# Patient Record
Sex: Male | Born: 1965 | Race: White | Hispanic: No | Marital: Married | Smoking: Never smoker
Health system: Southern US, Community
[De-identification: ages and names within clinical notes are randomized; demographics above are authoritative.]

## PROBLEM LIST (undated history)

## (undated) DIAGNOSIS — R519 Headache, unspecified: Secondary | ICD-10-CM

## (undated) DIAGNOSIS — G44009 Cluster headache syndrome, unspecified, not intractable: Secondary | ICD-10-CM

## (undated) HISTORY — DX: Cluster headache syndrome, unspecified, not intractable: G44.009

## (undated) HISTORY — DX: Headache, unspecified: R51.9

---

## 2013-05-15 ENCOUNTER — Other Ambulatory Visit (HOSPITAL_COMMUNITY): Payer: Self-pay | Admitting: Nurse Practitioner

## 2013-05-15 ENCOUNTER — Encounter (HOSPITAL_COMMUNITY): Payer: Self-pay

## 2013-05-15 ENCOUNTER — Ambulatory Visit (HOSPITAL_COMMUNITY)
Admission: RE | Admit: 2013-05-15 | Discharge: 2013-05-15 | Disposition: A | Discharge status: Home / Self Care | Payer: Managed Care, Other (non HMO) | Source: Ambulatory Visit | Attending: Nurse Practitioner | Admitting: Nurse Practitioner

## 2013-05-15 DIAGNOSIS — R1031 Right lower quadrant pain: Secondary | ICD-10-CM

## 2013-05-15 DIAGNOSIS — R1032 Left lower quadrant pain: Secondary | ICD-10-CM

## 2013-05-15 DIAGNOSIS — Q618 Other cystic kidney diseases: Secondary | ICD-10-CM | POA: Insufficient documentation

## 2013-05-15 DIAGNOSIS — R933 Abnormal findings on diagnostic imaging of other parts of digestive tract: Secondary | ICD-10-CM | POA: Insufficient documentation

## 2013-05-15 DIAGNOSIS — N433 Hydrocele, unspecified: Secondary | ICD-10-CM | POA: Insufficient documentation

## 2013-05-15 MED ORDER — IOHEXOL 300 MG/ML  SOLN
100.0000 mL | Freq: Once | INTRAMUSCULAR | Status: AC | PRN
  Administered 2013-05-15: 100 mL via INTRAVENOUS

## 2015-11-02 IMAGING — CT CT ABD-PELV W/ CM
2 of 5 series · 15 of 46 positions shown, 17 images · IV contrast (Omnipaque 300)
Comparison: None.

CLINICAL DATA: Right lower quadrant pain, left lower quadrant pain

EXAM:
CT ABDOMEN AND PELVIS WITH CONTRAST
TECHNIQUE: Multidetector CT imaging of the abdomen and pelvis was performed
using the standard protocol following bolus administration of
intravenous contrast.
CONTRAST:  100mL OMNIPAQUE IOHEXOL 300 MG/ML  SOLN

[Series 2: abd_pel_with 5.0 b40f · axial · 0.77mm/px · z∈[-508,-83]mm · 12 of 97 slices shown, 14 images]
[im 6/97  soft-tissue]
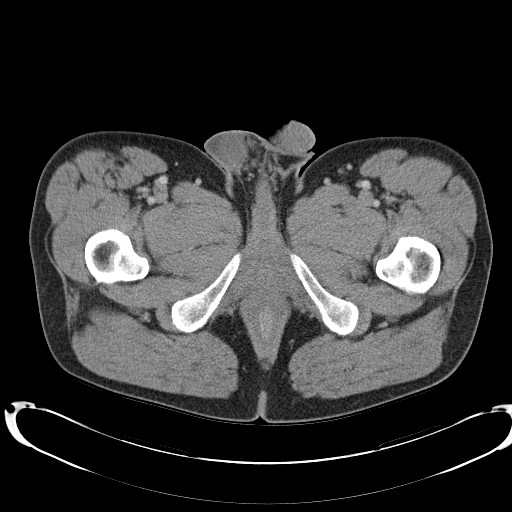
[im 6/97  bone]
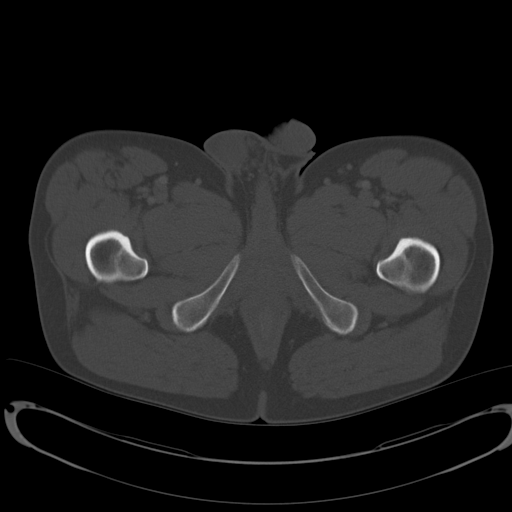
[im 17/97  soft-tissue]
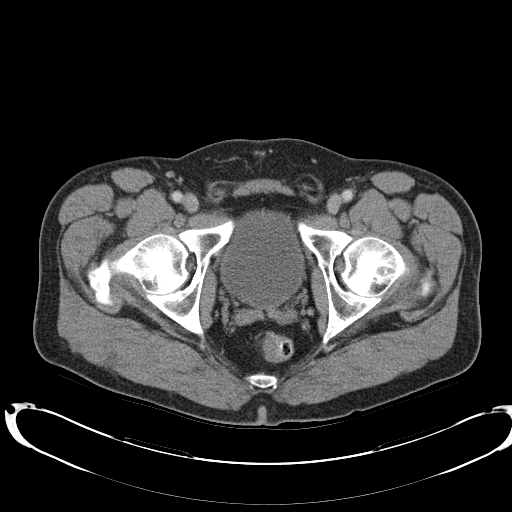
[im 22/97  soft-tissue]
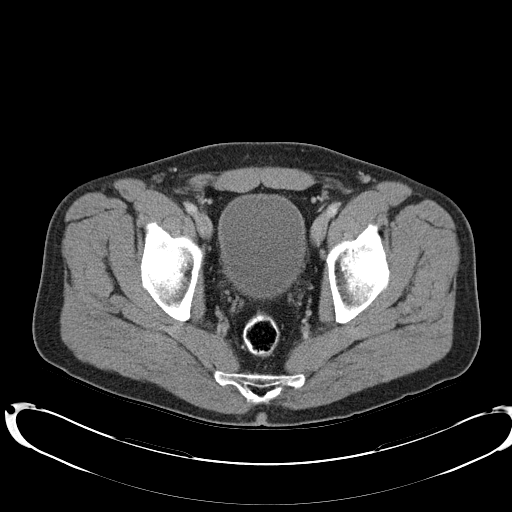
[im 27/97  soft-tissue]
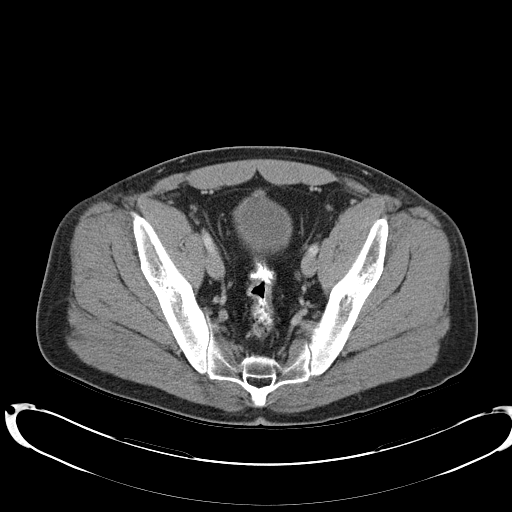
[im 38/97  soft-tissue]
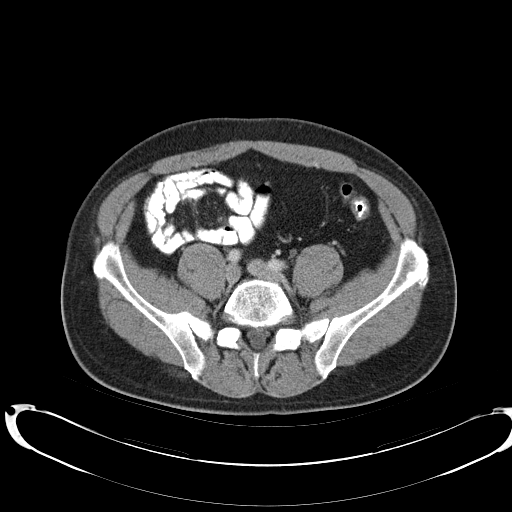
[im 43/97  soft-tissue]
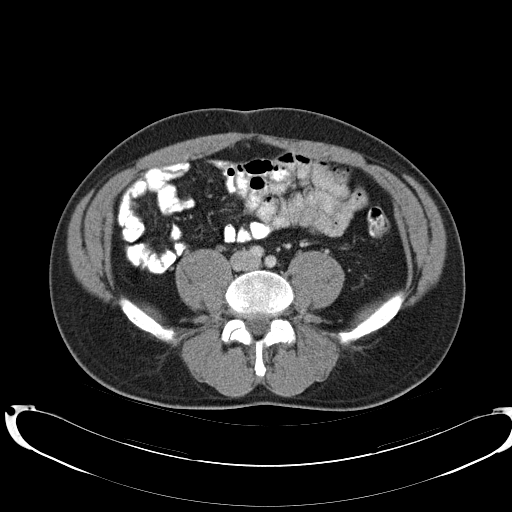
[im 54/97  soft-tissue]
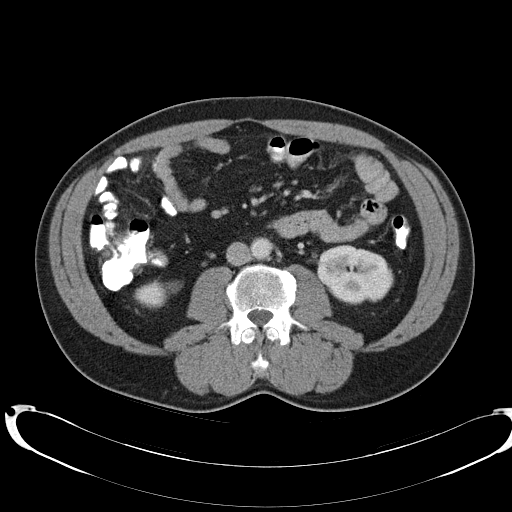
[im 59/97  soft-tissue]
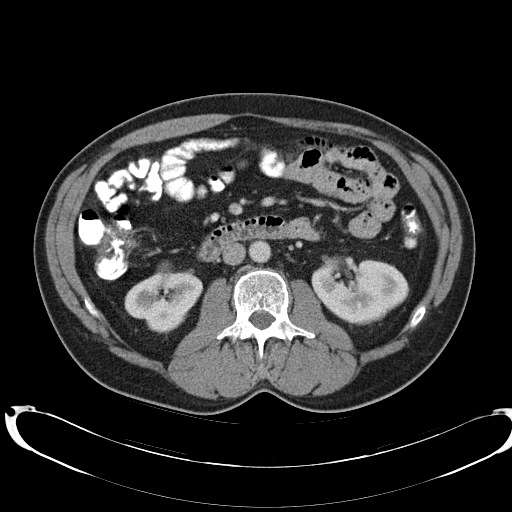
[im 70/97  soft-tissue]
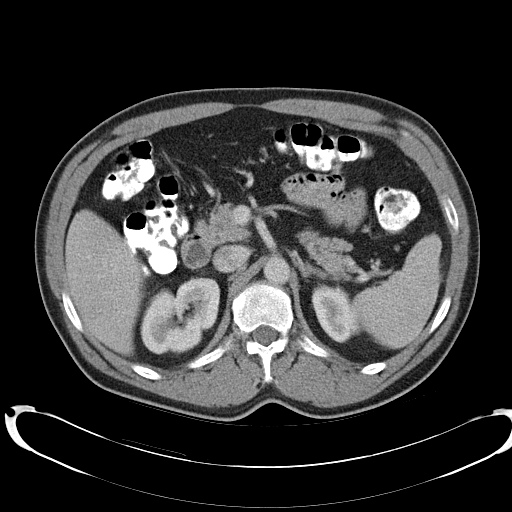
[im 70/97  bone]
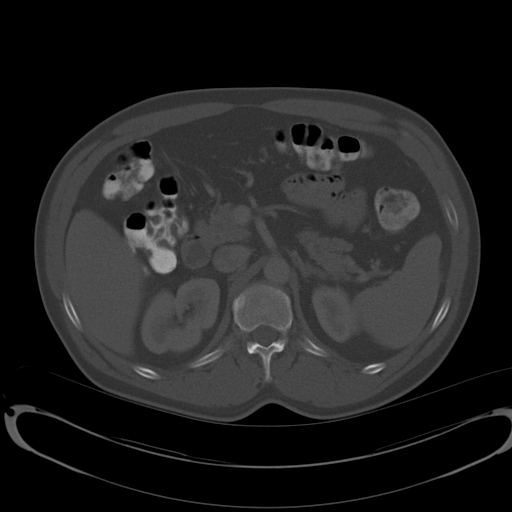
[im 75/97  soft-tissue]
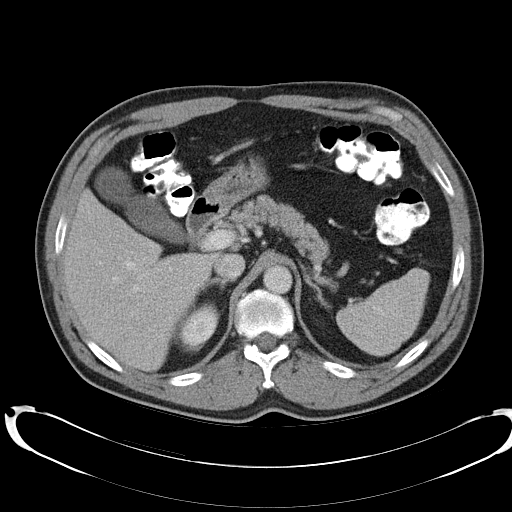
[im 81/97  soft-tissue]
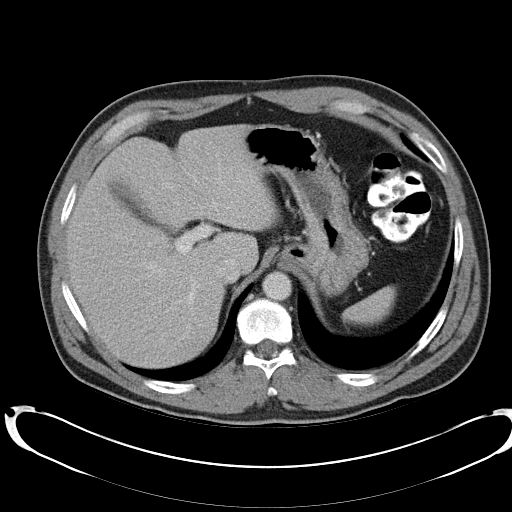
[im 91/97  soft-tissue]
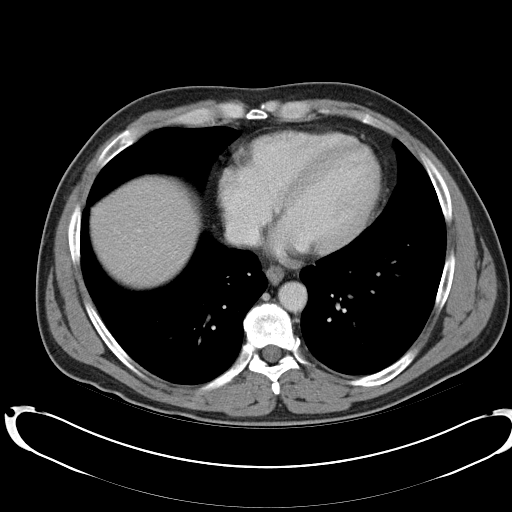

[Series 4: abd_pel_with 3.0 spo cor · coronal · 0.72mm/px · 3 of 85 slices shown]
[im 29/85  soft-tissue]
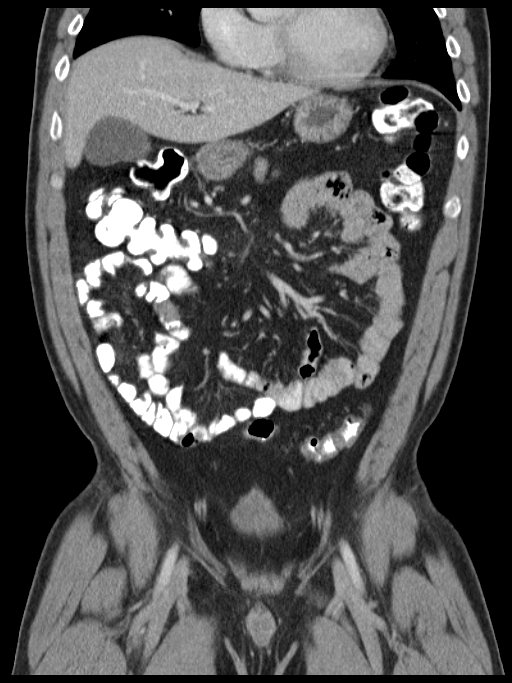
[im 38/85  soft-tissue]
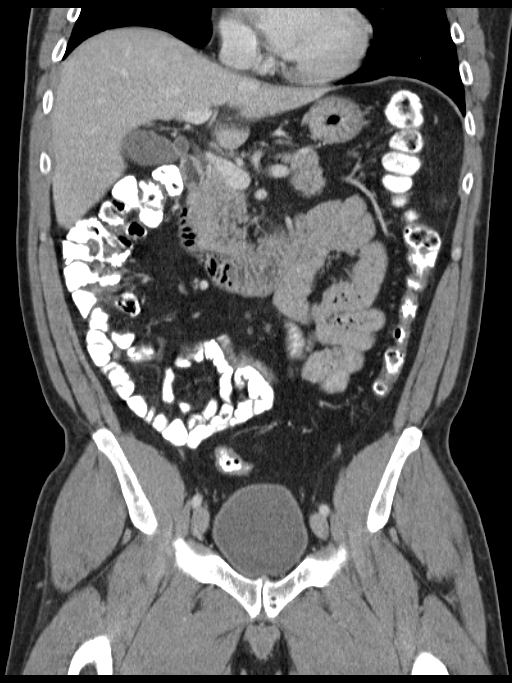
[im 47/85  soft-tissue]
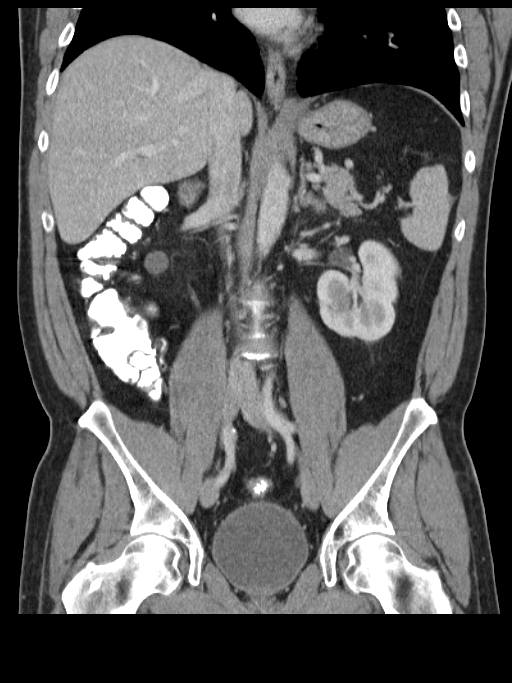

[15 of 46 positions shown; findings below may reference images not displayed]

FINDINGS: Sagittal images of the spine shows disc space flattening with mild
anterior and mild posterior spurring at L5-S1 level. Mild disc bulge
at L5-S1 level.

Lung bases are unremarkable.

Mild distended gallbladder without evidence of calcified gallstones.
Enhanced liver, pancreas spleen and adrenal glands are unremarkable.
Small accessory splenule is noted.

Bilateral kidney shows a lobulated contour. There is a cyst in
midpole of the right kidney measures 1.8 cm. Tiny cyst in lower pole
of the right kidney measures 6 mm. Tiny cyst in midpole of the right
kidney measures 8.8 mm. No hydronephrosis or hydroureter.

Delayed renal images shows bilateral renal symmetrical excretion.
Bilateral visualized proximal ureter is unremarkable.

Oral contrast material was given to the patient. No small bowel
obstruction. No ascites or free air. No adenopathy. Normal appendix.
No pericecal inflammation. The terminal ileum is unremarkable.

No evidence of diverticulitis. There is minimal thickening of distal
left colon and proximal sigmoid colon wall. This most likely is due
to incomplete distension/collapsed wall rather than colitis. No
surrounding inflammation. No mesenteric fluid collection. The
urinary bladder is unremarkable. No distal colonic obstruction.
Prostate gland and seminal vesicles are unremarkable. No calcified
calculi are noted within urinary bladder. Tiny right hydrocele.
IMPRESSION: 1. No small bowel or colonic obstruction. Nonspecific minimal
thickening of distal left colon and proximal sigmoid colon wall most
likely due to incomplete distended partially collapsed colonic wall.
Less likely colitis. No mesenteric stranding. No acute
diverticulitis.
2. No hydronephrosis or hydroureter.  Right renal cysts are noted.
3. No pericecal inflammation.  Normal appendix.
4. Unremarkable urinary bladder.
5. Tiny right hydrocele.

## 2020-10-24 ENCOUNTER — Encounter: Payer: Self-pay | Admitting: Neurology

## 2020-11-20 ENCOUNTER — Telehealth: Payer: Self-pay

## 2020-11-20 NOTE — Telephone Encounter (Signed)
error 

## 2020-12-01 ENCOUNTER — Encounter: Payer: Self-pay | Admitting: *Deleted
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
## 2020-12-03 ENCOUNTER — Ambulatory Visit (INDEPENDENT_AMBULATORY_CARE_PROVIDER_SITE_OTHER): Payer: Managed Care, Other (non HMO) | Admitting: Psychiatry

## 2020-12-03 ENCOUNTER — Encounter: Payer: Self-pay | Admitting: Psychiatry

## 2020-12-03 VITALS — BP 188/107 | HR 78 | Ht 65.0 in | Wt 179.0 lb

## 2020-12-03 DIAGNOSIS — G4451 Hemicrania continua: Secondary | ICD-10-CM

## 2020-12-03 MED ORDER — INDOMETHACIN 25 MG PO CAPS: ORAL_CAPSULE | ORAL | 0 refills | Status: AC

## 2020-12-03 NOTE — Patient Instructions (Addendum)
Start Indomethacin to assess for Hemicrania Continua  Week  1:  -Please obtain generic omeprazole (20 mg) [[employer], [employer], [employer], etc] and start one daily. You may increase to twice a day if necessary. -Please start indomethacin (indocin) 25 mg (one capsule) 3 times per day with meals. -If, in one week, you are headache-free, this is your dose, stay on it. -If, in one week, you are tolerating the indocin, but have headache, then increase the dose as below. Week 2: -Please increase indocin to 50 mg (2 capsules) 3 times per day with meals. -If, in one week, you are headache-free, this is your dose, stay on it. -If, in one week, you are tolerating the indocin, but have headache, then increase the dose as below. Weeks 3:  -Please increase indocin to 75 mg (3 capsules) 3 times per day with meals. -If, in one week, you are headache-free, this is your dose, stay on it. -If, in one week, you have partial relief, call or see her neurologist. -If, in one week, you have no relief, stop the indocin.

## 2020-12-22 ENCOUNTER — Ambulatory Visit: Payer: Managed Care, Other (non HMO) | Admitting: Neurology

## 2020-12-22 ENCOUNTER — Ambulatory Visit: Payer: Managed Care, Other (non HMO) | Admitting: Psychiatry

## 2020-12-23 ENCOUNTER — Other Ambulatory Visit: Payer: Self-pay | Admitting: Psychiatry

## 2020-12-23 ENCOUNTER — Telehealth: Payer: Self-pay | Admitting: Psychiatry

## 2020-12-23 ENCOUNTER — Encounter: Payer: Self-pay | Admitting: *Deleted

## 2020-12-23 MED ORDER — SUMATRIPTAN SUCCINATE 6 MG/0.5ML ~~LOC~~ SOAJ: 6.0000 mg | SUBCUTANEOUS | 2 refills | Status: DC | PRN

## 2020-12-23 MED ORDER — PREDNISONE 10 MG PO TABS: ORAL_TABLET | ORAL | 0 refills | Status: AC

## 2020-12-23 MED ORDER — VERAPAMIL HCL 40 MG PO TABS: ORAL_TABLET | ORAL | 2 refills | Status: DC

## 2020-12-23 NOTE — Telephone Encounter (Signed)
Called patient and informed him Dr Delena Bali has sent in new prescriptions with specific directions for use. He uses my chart, so I advised I;ll send him a copy of her instructions. Patient stated he will print them, verbalized understanding, appreciation.

## 2020-12-23 NOTE — Telephone Encounter (Signed)
I will send in the following medications to treat for presumed cluster headache. He can stop the indomethacin once he starts these.  1. Prednisone taper to help break current headache cycle: start at 70 mg, then decrease by 10 mg every 3 days until off  2. Verapamil to prevent further headaches: Take 40 mg three times daily.  -If continues to have cluster headaches in 3 days, increase to 40 mg each morning, 40 mg each afternoon, and 80 mg (two tablets) at bedtime as long as tolerating the medication (no dizziness, lightheadedness, excess fatigue).  -If tolerating the medicine, and headaches continue in 3 days, increase to 40 mg each morning, 80 mg each afternoon and 80 mg at bedtime.  -If tolerating the medicine, and headaches continue in 3 days, increase to 80 mg three times daily.   3. Imitrex injection to take as needed for headaches. Take at the first sign of headache. May repeat a dose in 1 hour if headache persists.

## 2020-12-23 NOTE — Telephone Encounter (Signed)
Called patient who stated the indomethacin medication is wearing off within 3 hours. He's taking 75 mg three x a day. His headache comes right back. He only has one day of medication left, has been struggling the past 2 days with headaches. I advised him I'll send message  to Dr Delena Bali. Patient verbalized understanding, appreciation.

## 2020-12-23 NOTE — Telephone Encounter (Signed)
@   11:03 pt left vm stating he was told by Dr Delena Bali that if he had any new issues to call.  Pt briefly stated he is having new issues and wants to be called. Pt did not leave the best # to reach him on.

## 2021-03-10 ENCOUNTER — Ambulatory Visit (INDEPENDENT_AMBULATORY_CARE_PROVIDER_SITE_OTHER): Payer: 59 | Admitting: Psychiatry

## 2021-03-10 ENCOUNTER — Encounter: Payer: Self-pay | Admitting: Psychiatry

## 2021-03-10 ENCOUNTER — Other Ambulatory Visit: Payer: Self-pay

## 2021-03-10 VITALS — BP 175/108 | HR 107 | Ht 65.0 in | Wt 181.8 lb

## 2021-03-10 DIAGNOSIS — G44019 Episodic cluster headache, not intractable: Secondary | ICD-10-CM | POA: Diagnosis not present

## 2021-03-10 NOTE — Progress Notes (Signed)
° °  CC:  headaches  Follow-up Visit  Last visit: 12/03/20  Brief HPI: 56 year old male with a history of migraines who follows in clinic for left sided headaches with ipsilateral lacrimation and conjunctival injection. MRI brain was normal.  At his last visit he was started on Indomethacin trial to rule out hemicrania continua.  Interval History: He did not have improvement on indomethacin so it was discontinued. He was started on a prednisone taper and verapamil 40 mg TID in December for presumed cluster headache. Headaches resolved in mid-January. He has stopped taking verapamil and continues to be headache free. He does report mild intermittent sinus pressure, but this is not bothersome for him and he does not take medication for it.   He never used the subQ Imitrex as he did not have severe headaches after starting prednisone and verapamil.  Headache days per month: 0 Headache free days per month: 30  Current Headache Regimen: Preventative: none Abortive: Imitrex 6 mg subq  Prior Therapies                                  Indomethacin - lack of efficacy Verapamil 40 mg TID Prednisone taper  Physical Exam:   Vital Signs: BP (!) 175/108    Pulse (!) 107    Ht 5\' 5"  (1.651 m)    Wt 181 lb 12.8 oz (82.5 kg)    BMI 30.25 kg/m  GENERAL:  well appearing, in no acute distress, alert  SKIN:  Color, texture, turgor normal. No rashes or lesions HEAD:  Normocephalic/atraumatic. RESP: normal respiratory effort MSK:  No gross joint deformities.   NEUROLOGICAL: Mental Status: Alert, oriented to person, place and time, Follows commands, and Speech fluent and appropriate. Cranial Nerves: PERRL, face symmetric, no dysarthria, hearing grossly intact Motor: moves all extremities equally Gait: normal-based.  IMPRESSION: 56 year old male with a history of migraines who presents for follow up of cluster headaches. His headaches resolved with prednisone taper and verapamil. He has  discontinued verapamil and remains headache free. He will let me know if he develops another cluster cycle, at which point we can restart verapamil and do another prednisone taper as needed. Encouraged him to try Imitrex subq the next time he develops a cluster headache.  PLAN: -Rescue: Imitrex 6 mg subq -Stop verapamil -Return to clinic if cluster headaches return   Follow-up: as needed  I spent a total of 22 minutes on the date of the service. Discussed treatment options including preventive and acute medications.   58, MD 03/10/21 1:48 PM
